# Patient Record
Sex: Female | Born: 1985 | Hispanic: No | Marital: Single | Smoking: Never smoker
Health system: Southern US, Community
[De-identification: ages and names within clinical notes are randomized; demographics above are authoritative.]

## PROBLEM LIST (undated history)

## (undated) DIAGNOSIS — J45909 Unspecified asthma, uncomplicated: Secondary | ICD-10-CM

## (undated) DIAGNOSIS — G4730 Sleep apnea, unspecified: Secondary | ICD-10-CM

## (undated) DIAGNOSIS — E119 Type 2 diabetes mellitus without complications: Secondary | ICD-10-CM

---

## 2014-06-26 ENCOUNTER — Emergency Department (HOSPITAL_COMMUNITY): Payer: Medicare Other

## 2014-06-26 ENCOUNTER — Emergency Department (HOSPITAL_COMMUNITY)
Admission: EM | Admit: 2014-06-26 | Discharge: 2014-06-27 | Disposition: A | Discharge status: Home / Self Care | Payer: Medicare Other | Attending: Emergency Medicine | Admitting: Emergency Medicine

## 2014-06-26 ENCOUNTER — Encounter (HOSPITAL_COMMUNITY): Payer: Self-pay | Admitting: Emergency Medicine

## 2014-06-26 DIAGNOSIS — N760 Acute vaginitis: Secondary | ICD-10-CM | POA: Insufficient documentation

## 2014-06-26 DIAGNOSIS — Z3202 Encounter for pregnancy test, result negative: Secondary | ICD-10-CM | POA: Diagnosis not present

## 2014-06-26 DIAGNOSIS — N390 Urinary tract infection, site not specified: Secondary | ICD-10-CM | POA: Insufficient documentation

## 2014-06-26 DIAGNOSIS — Z880 Allergy status to penicillin: Secondary | ICD-10-CM | POA: Insufficient documentation

## 2014-06-26 DIAGNOSIS — Y998 Other external cause status: Secondary | ICD-10-CM | POA: Insufficient documentation

## 2014-06-26 DIAGNOSIS — S91331A Puncture wound without foreign body, right foot, initial encounter: Secondary | ICD-10-CM | POA: Insufficient documentation

## 2014-06-26 DIAGNOSIS — E119 Type 2 diabetes mellitus without complications: Secondary | ICD-10-CM | POA: Insufficient documentation

## 2014-06-26 DIAGNOSIS — Y9301 Activity, walking, marching and hiking: Secondary | ICD-10-CM | POA: Insufficient documentation

## 2014-06-26 DIAGNOSIS — Y9289 Other specified places as the place of occurrence of the external cause: Secondary | ICD-10-CM | POA: Insufficient documentation

## 2014-06-26 DIAGNOSIS — M79671 Pain in right foot: Secondary | ICD-10-CM

## 2014-06-26 DIAGNOSIS — Z8669 Personal history of other diseases of the nervous system and sense organs: Secondary | ICD-10-CM | POA: Insufficient documentation

## 2014-06-26 DIAGNOSIS — N898 Other specified noninflammatory disorders of vagina: Secondary | ICD-10-CM | POA: Diagnosis present

## 2014-06-26 DIAGNOSIS — W25XXXA Contact with sharp glass, initial encounter: Secondary | ICD-10-CM | POA: Diagnosis not present

## 2014-06-26 DIAGNOSIS — J45909 Unspecified asthma, uncomplicated: Secondary | ICD-10-CM | POA: Diagnosis not present

## 2014-06-26 DIAGNOSIS — Z79899 Other long term (current) drug therapy: Secondary | ICD-10-CM | POA: Insufficient documentation

## 2014-06-26 DIAGNOSIS — B9689 Other specified bacterial agents as the cause of diseases classified elsewhere: Secondary | ICD-10-CM

## 2014-06-26 HISTORY — DX: Type 2 diabetes mellitus without complications: E11.9

## 2014-06-26 HISTORY — DX: Sleep apnea, unspecified: G47.30

## 2014-06-26 HISTORY — DX: Unspecified asthma, uncomplicated: J45.909

## 2014-06-26 LAB — URINE MICROSCOPIC-ADD ON

## 2014-06-26 LAB — POC URINE PREG, ED: Preg Test, Ur: NEGATIVE

## 2014-06-26 LAB — URINALYSIS, ROUTINE W REFLEX MICROSCOPIC
BILIRUBIN URINE: NEGATIVE
Glucose, UA: >1000 mg/dL — AB
KETONES UR: NEGATIVE mg/dL
NITRITE: POSITIVE — AB
PH: 6 (ref 5.0–8.0)
Protein, ur: NEGATIVE mg/dL
Specific Gravity, Urine: 1.044 — ABNORMAL HIGH (ref 1.005–1.030)
Urobilinogen, UA: 0.2 mg/dL (ref 0.0–1.0)

## 2014-06-26 NOTE — ED Notes (Signed)
Pt sts vaginal itching and burning with discharge that is grey/brown in color

## 2014-06-26 NOTE — ED Provider Notes (Signed)
CSN: [id number]     Arrival date & time 06/26/14  1621 History   First MD Initiated Contact with Patient 06/26/14 2032     Chief Complaint  Patient presents with  . Vaginal Discharge     (Consider location/radiation/quality/duration/timing/severity/associated sxs/prior Treatment) HPI Patient's 29 year old female with past medical history of diabetes who presents the ER with multiple complaints  Patient's main complaint tonight is a history of 4 days of vaginal irritation, itching and burning sensation externally with a discolored discharge. Patient states her symptoms have been gradual in onset, however persistent. Patient reports an associated vertigo sensation when she urinates. Patient denies any nausea, vomiting, abdominal pain, pelvic pain, cramping, vaginal bleeding. Patient states she is not currently sexually active, her last sexual encounter was several months ago, and patient states she only has sexual relationships with females.  Patient's second complaint that she brings up is what she believes to be a possible foreign body in her heel. Patient states she's walking through an apartment one week ago, stepped on a piece of glass which went through her shoe and into the sole of her foot. Patient states she did not visualize a glass, and feels that it has been retained in her foot. Patient states she's been able to unable to feel or pull anything out, and she is unsure of the size, shape or consistency of the foreign body itself. Patient denies numbness or weakness. Patient states her last tetanus booster was 2 years ago.  Past Medical History  Diagnosis Date  . Asthma   . Diabetes mellitus without complication   . Sleep apnea    History reviewed. No pertinent past surgical history. History reviewed. No pertinent family history. History  Substance Use Topics  . Smoking status: Never Smoker   . Smokeless tobacco: Not on file  . Alcohol Use: No   OB History    No data available      Review of Systems  Constitutional: Negative for fever.  HENT: Negative for trouble swallowing.   Eyes: Negative for visual disturbance.  Respiratory: Negative for shortness of breath.   Cardiovascular: Negative for chest pain.  Gastrointestinal: Negative for nausea, vomiting and abdominal pain.  Genitourinary: Positive for dysuria and vaginal discharge. Negative for urgency, vaginal bleeding and pelvic pain.  Musculoskeletal: Negative for neck pain.  Skin: Negative for rash.  Neurological: Negative for dizziness, weakness and numbness.  Psychiatric/Behavioral: Negative.       Allergies  Penicillins and Bactrim  Home Medications   Prior to Admission medications   Medication Sig Start Date End Date Taking? Authorizing Provider  metFORMIN (GLUCOPHAGE) 1000 MG tablet Take 1,000 mg by mouth 2 (two) times daily with a meal.   Yes Historical Provider, MD  ciprofloxacin (CIPRO) 500 MG tablet Take 1 tablet (500 mg total) by mouth 2 (two) times daily. One po bid x 7 days 06/27/14   Ladona Mow, PA-C  metroNIDAZOLE (METROGEL) 0.75 % vaginal gel Place 1 Applicatorful vaginally daily. For 5 days 06/27/14   Ladona Mow, PA-C   BP 125/74 mmHg  Pulse 74  Temp(Src) 98.1 F (36.7 C) (Oral)  Resp 16  SpO2 98%  LMP 05/28/2014 Physical Exam  Constitutional: She is oriented to person, place, and time. She appears well-developed and well-nourished. No distress.  Morbidly obese female well appearing, and in no acute distress.  HENT:  Head: Normocephalic and atraumatic.  Mouth/Throat: Oropharynx is clear and moist. No oropharyngeal exudate.  Eyes: Right eye exhibits no discharge. Left eye exhibits  no discharge. No scleral icterus.  Neck: Normal range of motion.  Cardiovascular: Normal rate, regular rhythm and normal heart sounds.   No murmur heard. Pulmonary/Chest: Effort normal and breath sounds normal. No respiratory distress.  Abdominal: Soft. There is no tenderness.  Genitourinary: Pelvic  exam was performed with patient supine. No labial fusion. There is tenderness on the right labia. There is no rash, lesion or injury on the right labia. There is tenderness on the left labia. There is no rash, lesion or injury on the left labia. Cervix exhibits no motion tenderness, no discharge and no friability. Right adnexum displays no mass, no tenderness and no fullness. Left adnexum displays no mass, no tenderness and no fullness. No erythema, tenderness or bleeding in the vagina. No foreign body around the vagina. No signs of injury around the vagina. Vaginal discharge found.  Mild amount of white, crusty discharge noted on labia bilaterally.  Pt has tenderness and itching with palpation to external labia and introitus.  Mild, white vaginal discharge noted in vaginal vault.  No CMT, no adnexal tenderness.  Chaperone present during entire pelvic exam.    Musculoskeletal: Normal range of motion. She exhibits no edema or tenderness.  Neurological: She is alert and oriented to person, place, and time. No cranial nerve deficit. Coordination normal.  Skin: Skin is warm and dry. No rash noted. She is not diaphoretic.  Punctate wound noted to the plantar aspect of right foot. No obvious abrasion, laceration, erythema, warmth, swelling, induration, signs of cellulitis, infection. Unable to visualize, palpate or appreciate any foreign body.  Psychiatric: She has a normal mood and affect.  Nursing note and vitals reviewed.   ED Course  Procedures (including critical care time) Labs Review Labs Reviewed  WET PREP, GENITAL - Abnormal; Notable for the following:    Yeast Wet Prep HPF POC FEW (*)    Clue Cells Wet Prep HPF POC FEW (*)    WBC, Wet Prep HPF POC MODERATE (*)    All other components within normal limits  URINALYSIS, ROUTINE W REFLEX MICROSCOPIC - Abnormal; Notable for the following:    APPearance CLOUDY (*)    Specific Gravity, Urine 1.044 (*)    Glucose, UA >1000 (*)    Hgb urine  dipstick TRACE (*)    Nitrite POSITIVE (*)    Leukocytes, UA SMALL (*)    All other components within normal limits  URINE MICROSCOPIC-ADD ON - Abnormal; Notable for the following:    Squamous Epithelial / LPF FEW (*)    Bacteria, UA MANY (*)    All other components within normal limits  URINE CULTURE  POC URINE PREG, ED  GC/CHLAMYDIA PROBE AMP ([place])    Imaging Review Dg Foot Complete Right  06/26/2014   CLINICAL DATA:  Patient stepped on glass with right foot 1 month ago with pain in the area of the heel, sensation of foreign body in the heel  EXAM: RIGHT FOOT COMPLETE - 3+ VIEW  COMPARISON:  None.  FINDINGS: No fracture or dislocation. Small heel spur. No radiodense foreign body. Mild arthritis of the tibiotalar joint. Mild arthritis of the articulation between the navicular and the adjacent cuneiform was. Moderate arthritis between the cuneiforms and the adjacent metatarsals.  IMPRESSION: Multifocal arthritic change. No radio opaque foreign body. However, please note that the soft tissues of the heel on the lateral view are projecting over another portion of anatomy leading to suboptimal visualization of the soft tissues. Optimal imaging would include repeating  the lateral foot radiograph.   Electronically Signed   By: Esperanza Heiraymond  Rubner M.D.   On: 06/26/2014 21:36     EKG Interpretation None      MDM   Final diagnoses:  Foot pain, right  Vulvovaginitis  Bacterial vaginosis  Puncture wound to foot, right, initial encounter  UTI (lower urinary tract infection)    1. Vaginal irritation Patient's signs and symptoms consistent with a urinary tract infection, in addition to a vulvovaginal candidiasis as well as a bacterial vaginosis based on exam and findings of urinalysis and vaginal wet prep. There is no concern for PID or cervicitis based on exam.   2. Possible foreign body Radiographs obtained of patient's foot, and although there is no obvious foreign body in the  cutaneous tissue appreciated, radiographic artifact was noted and repeat imaging was requested with imaging of the lateral foot radiograph by the radiologist. I notified patient of this, and notify her that we would place a bee on the area where she was experiencing pain, however patient adamantly declined having further radiograph of her foot. Patient states her main concern is her vaginal irritation, and she does not want to miss the bus. I discussed the benefits and risks of patient declining further evaluation of her foot given her history of diabetes and the high incidence of infection with foreign bodies through shoes, patient states she is adamant that she did not want any further workup on her foot at this time, and that she was mainly concerned about her vaginal irritation.  With patient's multiple complaints, we'll treat vulvovaginal candidiasis with a one-time dose of fluconazole in the ER. To avoid giving patient multiple oral antibiotics, we'll prescribe patient with a metronidazole applicator for her bacterial vaginosis, and place patient on ciprofloxacin for a urinary tract infection as well as for prophylaxis for her possible foreign body in her foot. Strongly encouraged patient follow-up with an OB/GYN, a referral to women's clinic for her gynecologic issues. Also strongly encouraged patient follow-up with orthopedics regarding her possible foreign body in her foot, and stressed the importance of this follow-up to avoid infection. I also encouraged patient follow-up with her primary care provider and provided patient with a resource guide to help her find one. I discussed return precautions with patient, and patient verbalizes understanding and agreement of this plan. I encouraged patient to call or return to the ER pending worsening of symptoms or should she have any questions or concerns.  BP 125/74 mmHg  Pulse 74  Temp(Src) 98.1 F (36.7 C) (Oral)  Resp 16  SpO2 98%  LMP  05/28/2014  Signed,  Ladona MowJoe Kienna Moncada, PA-C 2:34 AM  Patient discussed with Dr. Rolan BuccoMelanie Belfi, MD  Ladona MowJoe Toby Ayad, PA-C 06/27/14 [id number]  Rolan BuccoMelanie Belfi, MD 06/27/14 1759

## 2014-06-27 LAB — WET PREP, GENITAL: TRICH WET PREP: NONE SEEN

## 2014-06-27 LAB — GC/CHLAMYDIA PROBE AMP (~~LOC~~) NOT AT ARMC
CHLAMYDIA, DNA PROBE: NEGATIVE
Neisseria Gonorrhea: NEGATIVE

## 2014-06-27 MED ORDER — AZITHROMYCIN 1 G PO PACK: 2.0000 g | PACK | Freq: Once | ORAL | Status: DC

## 2014-06-27 MED ORDER — METRONIDAZOLE 0.75 % VA GEL: 1.0000 | Freq: Every day | VAGINAL | Status: AC

## 2014-06-27 MED ORDER — FLUCONAZOLE 100 MG PO TABS
150.0000 mg | ORAL_TABLET | Freq: Once | ORAL | Status: AC
  Administered 2014-06-27: 150 mg via ORAL
  Filled 2014-06-27: qty 2

## 2014-06-27 MED ORDER — CIPROFLOXACIN HCL 500 MG PO TABS: 500.0000 mg | ORAL_TABLET | Freq: Two times a day (BID) | ORAL | Status: AC

## 2014-06-27 NOTE — Discharge Instructions (Signed)
Follow up with Women's clinic for your vaginal discomfort.  Follow up with Orthopedics for the glass wound to your foot.  Take medications as prescribed.  Return to the ER with any severe swelling, redness, warmth of your foot, or high fever.  Refer to resource guide below to help find primary care provider.     Candidal Vulvovaginitis Candidal vulvovaginitis is an infection of the vagina and vulva. The vulva is the skin around the opening of the vagina. This may cause itching and discomfort in and around the vagina.  HOME CARE  Only take medicine as told by your doctor.  Do not have sex (intercourse) until the infection is healed or as told by your doctor.  Practice safe sex.  Tell your sex partner about your infection.  Do not douche or use tampons.  Wear cotton underwear. Do not wear tight pants or panty hose.  Eat yogurt. This may help treat and prevent yeast infections. GET HELP RIGHT AWAY IF:   You have a fever.  Your problems get worse during treatment or do not get better in 3 days.  You have discomfort, irritation, or itching in your vagina or vulva area.  You have pain after sex.  You start to get belly (abdominal) pain. MAKE SURE YOU:  Understand these instructions.  Will watch your condition.  Will get help right away if you are not doing well or get worse. Document Released: 06/27/2008 Document Revised: 04/05/2013 Document Reviewed: 06/27/2008 [facility] Patient Information 2015 [place], [place]. This information is not intended to replace advice given to you by your health care provider. Make sure you discuss any questions you have with your health care provider.  Bacterial Vaginosis Bacterial vaginosis is a vaginal infection that occurs when the normal balance of bacteria in the vagina is disrupted. It results from an overgrowth of certain bacteria. This is the most common vaginal infection in women of childbearing age. Treatment is important to prevent  complications, especially in pregnant women, as it can cause a premature delivery. CAUSES  Bacterial vaginosis is caused by an increase in harmful bacteria that are normally present in smaller amounts in the vagina. Several different kinds of bacteria can cause bacterial vaginosis. However, the reason that the condition develops is not fully understood. RISK FACTORS Certain activities or behaviors can put you at an increased risk of developing bacterial vaginosis, including:  Having a new sex partner or multiple sex partners.  Douching.  Using an intrauterine device (IUD) for contraception. Women do not get bacterial vaginosis from toilet seats, bedding, swimming pools, or contact with objects around them. SIGNS AND SYMPTOMS  Some women with bacterial vaginosis have no signs or symptoms. Common symptoms include:  Grey vaginal discharge.  A fishlike odor with discharge, especially after sexual intercourse.  Itching or burning of the vagina and vulva.  Burning or pain with urination. DIAGNOSIS  Your health care provider will take a medical history and examine the vagina for signs of bacterial vaginosis. A sample of vaginal fluid may be taken. Your health care provider will look at this sample under a microscope to check for bacteria and abnormal cells. A vaginal pH test may also be done.  TREATMENT  Bacterial vaginosis may be treated with antibiotic medicines. These may be given in the form of a pill or a vaginal cream. A second round of antibiotics may be prescribed if the condition comes back after treatment.  HOME CARE INSTRUCTIONS   Only take over-the-counter or prescription medicines as  directed by your health care provider.  If antibiotic medicine was prescribed, take it as directed. Make sure you finish it even if you start to feel better.  Do not have sex until treatment is completed.  Tell all sexual partners that you have a vaginal infection. They should see their health  care provider and be treated if they have problems, such as a mild rash or itching.  Practice safe sex by using condoms and only having one sex partner. SEEK MEDICAL CARE IF:   Your symptoms are not improving after 3 days of treatment.  You have increased discharge or pain.  You have a fever. MAKE SURE YOU:   Understand these instructions.  Will watch your condition.  Will get help right away if you are not doing well or get worse. FOR MORE INFORMATION  Centers for Disease Control and Prevention, Division of STD Prevention: SolutionApps.co.za American Sexual Health Association (ASHA): www.ashastd.org  Document Released: 03/31/2005 Document Revised: 01/19/2013 Document Reviewed: 11/10/2012 [facility] Patient Information 2015 [place], [place]. This information is not intended to replace advice given to you by your health care provider. Make sure you discuss any questions you have with your health care provider.   Splinters Splinters need to be removed because they can cause skin irritation and infection. If they are close to the surface, splinters can usually be removed easily. Deep splinters may be hard to locate and need treatment by a surgeon. SPLINTER REMOVAL Removal of splinters by your caregiver is considered a surgical procedure.   The area is carefully cleaned. You may require a small amount of anesthesia (medicine injected near the splinter to numb the tissue and lessen pain). After the splinter is removed, the area will be cleaned again. A bandage is applied.  If your splinter is under a fingernail or toenail, then a small section of the nail may need to be removed. As long as the splinter did not extend to the base of the nail, the nail usually grows back normally.  A splinter that is deeper, more contaminated, or that gets near a structure such as a bone, nerve or blood vessel may need to be removed by a Careers adviser.  You may need special X-rays or scans if the splinter is hard to  locate.  Every attempt is made to remove the entire splinter. However, small particles may remain. Tell your caregiver if you feel that a part of the splinter was left behind. HOME CARE INSTRUCTIONS   Keep the injured area high up (elevated).  Use the injured area as little as possible.  Keep the injured area clean and dry. Follow any directions from your caregiver.  Keep any follow-up or wound check appointments. You might need a tetanus shot now if:  You have no idea when you had the last one.  You have never had a tetanus shot before.  The injured area had dirt in it. Even if you have already removed the splinter, call your caregiver to get a tetanus shot if you need one.  If you need a tetanus shot, and you decide not to get one, there is a rare chance of getting tetanus. Sickness from tetanus can be serious. If you did get a tetanus shot, your arm may swell, get red and warm to the touch at the shot site. This is common and not a problem. SEEK MEDICAL CARE IF:   A splinter has been removed, but you are not better in a day or two.  You develop  a temperature.  Signs of infection develop such as:  Redness, swelling or pus around the wound.  Red streaks spreading back from your wound towards your body. Document Released: 05/08/2004 Document Revised: 08/15/2013 Document Reviewed: 04/10/2008 [facility] Patient Information 2015 [place], [place]. This information is not intended to replace advice given to you by your health care provider. Make sure you discuss any questions you have with your health care provider.   Emergency Department Resource Guide 1) Find a Doctor and Pay Out of Pocket Although you won't have to find out who is covered by your insurance plan, it is a good idea to ask around and get recommendations. You will then need to call the office and see if the doctor you have chosen will accept you as a new patient and what types of options they offer for patients who are  self-pay. Some doctors offer discounts or will set up payment plans for their patients who do not have insurance, but you will need to ask so you aren't surprised when you get to your appointment.  2) Contact Your Local Health Department Not all health departments have doctors that can see patients for sick visits, but many do, so it is worth a call to see if yours does. If you don't know where your local health department is, you can check in your phone book. The CDC also has a tool to help you locate your state's health department, and many state websites also have listings of all of their local health departments.  3) Find a Walk-in Clinic If your illness is not likely to be very severe or complicated, you may want to try a walk in clinic. These are popping up all over the country in pharmacies, drugstores, and shopping centers. They're usually staffed by nurse practitioners or physician assistants that have been trained to treat common illnesses and complaints. They're usually fairly quick and inexpensive. However, if you have serious medical issues or chronic medical problems, these are probably not your best option.  No Primary Care Doctor: - Call Health Connect at  [phone] - they can help you locate a primary care doctor that  accepts your insurance, provides certain services, etc. - Physician Referral Service- [phone]  Chronic Pain Problems: Organization         Address  Phone   Notes  [facility]  [phone] Patients need to be referred by their primary care doctor.   Medication Assistance: Organization         Address  Phone   Notes  [facility] Medication [facility] [address] [place], [place] [address] [phone] --Must be a resident of [facility] -- Must have NO insurance coverage whatsoever (no Medicaid/ Medicare, etc.) -- The pt. MUST have a primary care doctor that directs their care regularly and follows them in  the community   Med[facility]ist  [phone]   [employer]  [phone]    Agencies that provide inexpensive medical care: Organization         Address  Phone   Notes  [facility]  [phone]   [facility]    [phone]   [facility] [address] [place], [place] [address] [phone]   [facility] [address] [place], [place] [phone]   Planned Parenthood    [phone]   [facility]    [phone]   [facility]  [address], [place] Phone:  [phone](336) [phone], Fax:  [phone] Hours of Operation:  9 am - 6 pm, M-F.  Also accepts Medicaid/Medicare and self-pay.  [facility]  [address], [place] Phone: [phone]) [phone], Fax: [phone]. Hours of Operation:  8:30 am - 5:30 pm, M-F.  Also accepts Medicaid and self-pay.  [facility] [place] [address], [place] Phone: [phone]) [phone]   [facility] [address], [place], [place] [phone], Ext. [phone] Mondays & Thursdays: 7-9 AM.  First 15 patients are seen on a first come, first serve basis.    Medicaid-accepting [facility] Providers:  Organization         Address  Phone   Notes  [facility] [address], [place] [phone] Also accepts self-pay patients.  [facility] [address] [place], [place]  [phone]) [phone]   [facility] [address], [place] [phone]   [facility] [address] [place] [address], [place] [phone]) [phone]   Renaye RakersVeita Bland [address], [place]   [phone] Only accepts [place] Access [place] patients after they have their name applied to their card.   Self-Pay (no insurance) in [facility]:  Organization         Address  Phone   Notes  Sickle Cell Patients,  [facility] [address], [place] [phone]) [phone]   [facility] [address], [place] [phone]) [phone]   [facility] [place]  [address], [place] [phone]   [facility]/Dr. Osei-Bonsu  [address] [place] [address], [place] or [phone], [place] [phone]) [phone] Phone number for both [place] and [place] locations is the same.  [facility] [address], [place] [phone](336) [phone]   [facility] [address] [place] [address], [place] or [address] [phone](336) [phone](336) [phone]   [facility] [address], [place] [phone](336) [phone], phone; [phone](336) [phone], fax Sees patients 1st and 3rd Saturday of every month.  Must not qualify for public or private insurance (i.e. Medicaid, Medicare, [place] Health Choice, Veterans' Benefits)  Household income should be no more than 200% of the poverty level The clinic cannot treat you if you are pregnant or think you are pregnant  Sexually transmitted diseases are not treated at the clinic.    Dental Care: Organization         Address  Phone  Notes  [facility] of [facility] [address], [place] [phone]) [phone] Accepts children up to age 29 who are enrolled in [place] or [place] Health Choice; pregnant women with a Medicaid card; and children who have applied for Medicaid or [place] Health Choice, but were declined, whose parents can pay a reduced fee at time of service.  [facility] of [facility] [place]  [address], [place] [phone] Accepts children up to age 29 who are enrolled in [place] or [place] Health Choice; pregnant women with a Medicaid card; and children who have applied for Medicaid or  Health Choice, but were declined, whose parents can pay a reduced fee at time of service.  [facility]   [phone] [address]  [address], [place] [phone] Patients are seen by appointment only. Walk-ins are not accepted. [facility] will see patients 70 years of age and older. Monday - Tuesday (8am-5pm) Most Wednesdays (8:30-5pm) $30 per visit, cash only  [facility] Adult Dental Access PROGRAM  [address], [facility] [phone] Patients are seen by appointment only. Walk-ins are not accepted. [facility] will see patients 72 years of age and older. One Wednesday Evening (Monthly: Volunteer Based).  $30 per visit, cash only  [employer]  [phone] for adults; Children under age 22, call Graduate Pediatric Dentistry at [phone]. Children aged 56-14, please call [phone] to request a pediatric application.  Dental services are provided in all areas of dental care including fillings, crowns and bridges, complete and partial dentures, implants, gum treatment, root canals, and extractions. Preventive care is also provided. Treatment is provided to both adults and children. Patients are selected via a lottery and there is often a waiting list.   [facility] [address] [place]  [phone] [email or web address]   [facility] [address] [place], [place] [phone], Ext. [phone] Second and Fourth Thursday of each month, opens at 6:30 AM; Clinic ends at 9 AM.  Patients are seen on a first-come first-served basis, and a limited number are seen during each clinic.   [facility]  [address] [place] [place], [place] [phone]   Eligibility Requirements You must have lived in [place], [place], or [place] counties for at least the last three months.   You cannot be eligible for state or federal sponsored [place], including [employer], [place], or [employer].   You generally cannot be eligible for healthcare insurance through your employer.    How to apply: Eligibility screenings are  held every Tuesday and Wednesday afternoon from 1:00 pm until 4:00 pm. You do not need an appointment for the interview!  [facility] [address] [place], [place] [phone]   [facility] Health Department  [phone]   [facility] Health Department  [phone]   [facility] Health Department  [phone]    Behavioral Health Resources in the Community: Intensive Outpatient Programs Organization         Address  Phone  Notes  [facility] [address] [place], [place] [phone]   [facility] [address] [place], [place] [phone]   [facility] [address], [place], [place]  [phone]   [facility] Mental Health [address]  [place], [place] [phone] or [phone]   Substance Abuse Resources Organization         Address  Phone  Notes  [facility] Drug [facility]  [phone]   [facility]  [phone]   [facility] [place]  [phone]   Floydene Flock  [phone]   Residential & Outpatient Substance Abuse Program  [phone]   Psychological Services Organization         Address  Phone  Notes  [facility] Health  [phone]   [facility]  [phone]   [facility] [address], [place] [phone] or [phone]    Mobile Crisis Teams Organization         Address  Phone  Notes  [facility], Mobile Crisis Care Unit  [phone]   [facility]  [address] [place], [place] [phone]   Doristine Locks  [address] [place] [phone]    Self-Help/Support Groups Organization         Address  Phone             Notes  [facility]ental Health Assoc. of [place] - variety of support groups  336- I7437963 Call for more information  Narcotics Anonymous (NA), [facility] [address], [employer] [place]  2 meetings at this location     Statistician         Address  Phone  Notes  [facility] [address],    [place]  [phone]   [facility]  [address], [place] [address], [place], [place] [phone]   [facility] [address] [place], [place] [phone] Admissions: 8am-3pm M-F  [facility] [address]    [place], [place] [phone]   [facility] [address] [place], [place], [place] [phone]   [facility] [address]  [place], [place] [phone]   [facility] - Intensive Outpatient [address], [place], [place] [phone]   [facility] ([facility] [address]  [place], [place] [phone] or [phone]   [facility] (RTS) [address], [place], [place] [phone] Accepts Medicaid  [facility] [place] [address]  [place] [phone] Substance Abuse/Addiction Treatment   [facility] Organization         Address  Phone  Notes  [facility]  [phone]   Angie Fava, PhD [address] [place], [place]   [phone] or [phone]   [facility] Behavioral   [address] [employer], [place] [phone]   [facility] [address], [place], [place] [phone] Insurance/Medicaid/sponsorship through [facility] and Families [address]                                    [place], [place] [phone] Therapy/tele-psych/case  [facility] [address], [place] [phone]    Dr. Lolly Mustache  [phone]   [facility]  [employer] [facility] Dept. 1) [address], [place] 2) [address] [place] 3)  [address], [place] [phone]  [phone]   [facility] Child Abuse Hotline 775-423-6454 or (939) [phone] (After  Hours)

## 2014-06-28 LAB — URINE CULTURE

## 2014-09-02 ENCOUNTER — Emergency Department (HOSPITAL_COMMUNITY)
Admission: EM | Admit: 2014-09-02 | Discharge: 2014-09-13 | Disposition: E | Payer: Medicare Other | Attending: Emergency Medicine | Admitting: Emergency Medicine

## 2014-09-02 ENCOUNTER — Encounter (HOSPITAL_COMMUNITY): Payer: Self-pay

## 2014-09-02 DIAGNOSIS — S00511A Abrasion of lip, initial encounter: Secondary | ICD-10-CM | POA: Insufficient documentation

## 2014-09-02 DIAGNOSIS — Y999 Unspecified external cause status: Secondary | ICD-10-CM | POA: Insufficient documentation

## 2014-09-02 DIAGNOSIS — X58XXXA Exposure to other specified factors, initial encounter: Secondary | ICD-10-CM | POA: Diagnosis not present

## 2014-09-02 DIAGNOSIS — I469 Cardiac arrest, cause unspecified: Secondary | ICD-10-CM | POA: Diagnosis not present

## 2014-09-02 DIAGNOSIS — Y939 Activity, unspecified: Secondary | ICD-10-CM | POA: Diagnosis not present

## 2014-09-02 DIAGNOSIS — Y929 Unspecified place or not applicable: Secondary | ICD-10-CM | POA: Insufficient documentation

## 2014-09-02 MED ORDER — ETOMIDATE 2 MG/ML IV SOLN
INTRAVENOUS | Status: AC | PRN
  Administered 2014-09-02: 10 mg via INTRAVENOUS

## 2014-09-02 MED ORDER — SODIUM BICARBONATE 8.4 % IV SOLN
INTRAVENOUS | Status: AC | PRN
  Administered 2014-09-02: 100 meq via INTRAVENOUS

## 2014-09-02 MED ORDER — EPINEPHRINE HCL 0.1 MG/ML IJ SOSY
PREFILLED_SYRINGE | INTRAMUSCULAR | Status: AC | PRN
  Administered 2014-09-02: 0.1 mg via INTRAVENOUS

## 2014-09-02 MED ORDER — SODIUM CHLORIDE 0.9 % IV SOLN
INTRAVENOUS | Status: AC | PRN
  Administered 2014-09-02: 1000 mL via INTRAVENOUS

## 2014-09-02 MED ORDER — ROCURONIUM BROMIDE 50 MG/5ML IV SOLN
INTRAVENOUS | Status: AC | PRN
  Administered 2014-09-02: 100 mg via INTRAVENOUS

## 2014-09-02 MED ORDER — CALCIUM CHLORIDE 10 % IV SOLN
INTRAVENOUS | Status: AC | PRN
  Administered 2014-09-02: 1 g via INTRAVENOUS

## 2014-09-04 MED FILL — Medication: Qty: 1 | Status: AC

## 2014-09-13 NOTE — Progress Notes (Signed)
Chaplain responded to page from triage concerning arriving CPR.   Chaplain stopped by room to get status of pt, pt was  Deceased when Chaplain arrived.   Pt mother, father, sister and 29 yr old nephew in the consultation room as physician gave death notification.   Family is extremely overwhelmed as this is very sudden and the pt is very young.   Pt's sister is the most emotionally expressive, pt father and mother are attempting to stay collected for the grandchild and are still wrestling with the shock of it all.   Awaiting clearance to bring family to the room.   Family requested prayer and Chaplain prayed with family and provided emotional/spiritual support.   Angela Singleton, Angela Singleton, Chaplain 17-Dec-2014

## 2014-09-13 NOTE — ED Provider Notes (Signed)
CSN: [id number]     Arrival date & time 08-Oct-2014  0703 History   First MD Initiated Contact with Patient 026-Jun-2016 0720     Chief Complaint  Patient presents with  . Cardiac Arrest     (Consider location/radiation/quality/duration/timing/severity/associated sxs/prior Treatment) HPI  This is a 29 year old female who presents in cardiac arrest. Per EMS report, they were called to the patient's house after a syncopal episode. Upon arrival, patient was noted to be in respiratory distress but was awake and alert. She had notable facial trauma.  During initial evaluation, patient went And went into asystolic arrest. She received CPR for approximately 40 minutes prior to arrival. She received a total of 6 mg of epinephrine. She never achieved ROSC.  Upon arrival, patient was CPR in progress. King airway in place. Pupils 7 mm in nonreactive bilaterally. Initial pulse and rhythm check with asystolic arrest. CPR resumed. Patient given 3 mg of epinephrine total, 2 A of sodium bicarbonate, and 1 g of calcium chloride. Patient was intubated with good entitle CO2.  Patient never had ROSC. Time of death 7:19 AM.   History reviewed. No pertinent past medical history. History reviewed. No pertinent past surgical history. No family history on file. History  Substance Use Topics  . Smoking status: Never Smoker   . Smokeless tobacco: Not on file  . Alcohol Use: Not on file   OB History    No data available     Review of Systems  Unable to perform ROS: Acuity of condition      Allergies  Review of patient's allergies indicates not on file.  Home Medications   Prior to Admission medications   Not on File   There were no vitals taken for this visit. Physical Exam  Constitutional:  Morbidly obese, CPR in progress, obtunded, GCS 3  HENT:  Head: Normocephalic.  Dry blood about the oral cavity, abrasions noted over the lower lip  Eyes:  Pupils 7 mm nonreactive bilaterally  Cardiovascular:   Asystole, pulseless  Pulmonary/Chest:  Distant breath sounds with bagging, limited by body habitus  Neurological:  GCS 3  Nursing note and vitals reviewed.   ED Course  Procedures (including critical care time)  Cardiopulmonary Resuscitation (CPR) Procedure Note Directed/Performed by: Shon BatonHORTON, COURTNEY F I personally directed ancillary staff and/or performed CPR in an effort to regain return of spontaneous circulation and to maintain cardiac, neuro and systemic perfusion.   CRITICAL CARE Performed by: Shon BatonHORTON, COURTNEY F   Total critical care time: 60 min  Critical care time was exclusive of separately billable procedures and treating other patients.  Critical care was necessary to treat or prevent imminent or life-threatening deterioration.  Critical care was time spent personally by me on the following activities: development of treatment plan with patient and/or surrogate as well as nursing, discussions with consultants, evaluation of patient's response to treatment, examination of patient, obtaining history from patient or surrogate, ordering and performing treatments and interventions, ordering and review of laboratory studies, ordering and review of radiographic studies, pulse oximetry and re-evaluation of patient's condition.  INTUBATION Performed by: Shon BatonHORTON, COURTNEY F  Required items: required blood products, implants, devices, and special equipment available Patient identity confirmed: provided demographic data and hospital-assigned identification number Time out: Immediately prior to procedure a "time out" was called to verify the correct patient, procedure, equipment, support staff and site/side marked as required.  Indications: Cardiac arrest   Intubation method: Glidescope Laryngoscopy   Preoxygenation: BVM  Sedatives: Etomidate Paralytic: Roc  Tube Size: 7.5 cuffed  Post-procedure assessment:ETCO2 monitor Breath sounds: equal and absent over the  epigastrium Tube secured with: ETT holder Chest x-ray interpreted by radiologist and me.   Patient tolerated the procedure well with no immediate complications.    Labs Review Labs Reviewed - No data to display  Imaging Review No results found.   EKG Interpretation None      MDM   Final diagnoses:  Cardiac arrest    Patient presents in arrest. Currently an asystolic arrest. CPR in progress for greater than 40 minutes. Definitive airway placed and several rounds of CPR continued without return of circulation. Time of death called at 7:19 AM.   Family informed of patient's death. She did not have a primary care physician.  Spoke with the medical examiner's office. They will call back to Dr. Patria Mane regarding whether or not this is an ME case.    Shon Baton, MD 09-25-14 [phone]

## 2014-09-13 NOTE — Progress Notes (Signed)
Chaplain Note:   Chaplain with family as they are taking some time to be bedside with one another and taking some time alone.   Family shared some loving memories and told stories about the pt's vibrancy, "infectious laugh", "Lively spirit" and her "strength".   They noted the last four years were increasingly difficult for her.   They find some comfort in their spirituality. "We know she's not suffering anymore but it hurts so badly".   Express that the emotions come in waves.   Gala RomneyBrown, Mattison Golay J, Chaplain December 20, 2014

## 2014-09-13 NOTE — ED Provider Notes (Signed)
Spoke with ME, pt will not be a medical examiner case. Dr Wilkie AyeHorton to sign the death certificate  Angela BilisKevin Yatziry Deakins, MD 29-Sep-2014 [phone]

## 2014-09-13 NOTE — Progress Notes (Signed)
Chaplain Note:   Chaplain attained next of kin information. Given to nurse.   Also informed nurse that family wished to speak to care team about what happened as they are now in a place to hear it.   Family bedside. Closed visit with pt placement information and prayer.   No further support needed.   Chaplain signing off.

## 2014-09-13 NOTE — Code Documentation (Signed)
Asystole on the monitor, CPR continues, no pulse per Dr. Wilkie AyeHorton.

## 2014-09-13 NOTE — Code Documentation (Signed)
Annice PihJackie ME examiner called and stated, "At this time, I am declining the pt. As a Medical Examiners Case."

## 2014-09-13 NOTE — Code Documentation (Signed)
CPR in progress, Pulse check Asystole on the monitor, no pulse per Dr. Wilkie AyeHorton

## 2014-09-13 NOTE — Code Documentation (Signed)
Organ procurement team notified. Spoke with [name], Referral number is [phone] [id number].

## 2014-09-13 NOTE — Procedures (Signed)
Intubation Procedure Note Wiliam KeBella Amor Vanwingerden [id number] 11/16/1985  Procedure: Intubation Indications: ETT needed to be changed  Procedure Details Consent: Unable to obtain consent because of emergent medical necessity. Time Out: Verified patient identification, verified procedure, site/side was marked, verified correct patient position, special equipment/implants available, medications/allergies/relevent history reviewed, required imaging and test results available.  Performed  Maximum sterile technique was used including gloves, gown, hand hygiene and mask.  3    Evaluation Hemodynamic Status: BP never obtained; O2 sats: currently acceptable Patient's Current Condition: unstable Complications: No apparent complications Patient did tolerate procedure well. Chest X-ray ordered to verify placement.  CXR: pending.  [facility]. King removed, 7.5 ett replaced with difficult intubation due to very large tongue. Positive color change on ETCO2, direct visualization with glidescope. CXR pending.    Carolan ShiverKelley, Paytin Ramakrishnan M 12/28/2014

## 2014-09-13 NOTE — Code Documentation (Signed)
Family at beside. Family given emotional support. 

## 2014-09-13 NOTE — Progress Notes (Signed)
RT responded to CPR in Trauma A. Pt manually ventilated throughout. Pt with King airway upon arrival to ED. Continuous CPR. King Airway removed and 7.5 ETT replaced, difficult intubation due to large tongue. ETT secured at 24 at the lip. Resucitation efforts stopped and time of death called by ED MD at 07:19. ETT remains in place due to possible ME case.

## 2014-09-13 NOTE — ED Notes (Addendum)
Pt. Was found in bathroom, via [facility], face down. It was reported that she was in sinus Bradycardia .  Paramedics reports they witnessed Asystole and began CPR,for approximately 20 minutes in the bathroom. Pt. Arrived in Trauma A in asystole with a King Airway and CPR in progress  Dr. Wilkie AyeHorton and Dr. Patria Maneampos  at the bedside, RT at the bedside, CPR continued.

## 2014-09-13 DEATH — deceased

## 2014-10-13 ENCOUNTER — Ambulatory Visit (HOSPITAL_COMMUNITY): Payer: Medicare Other | Admitting: Psychiatry

## 2014-10-24 ENCOUNTER — Encounter (HOSPITAL_COMMUNITY): Payer: Self-pay | Admitting: Emergency Medicine

## 2016-09-14 IMAGING — CR DG FOOT COMPLETE 3+V*R*
3 series · 3 of 3 positions shown · non-contrast
Comparison: None.

CLINICAL DATA: Patient stepped on glass with right foot 1 month ago
with pain in the area of the heel, sensation of foreign body in the
heel

EXAM:
RIGHT FOOT COMPLETE - 3+ VIEW

[foot ap]
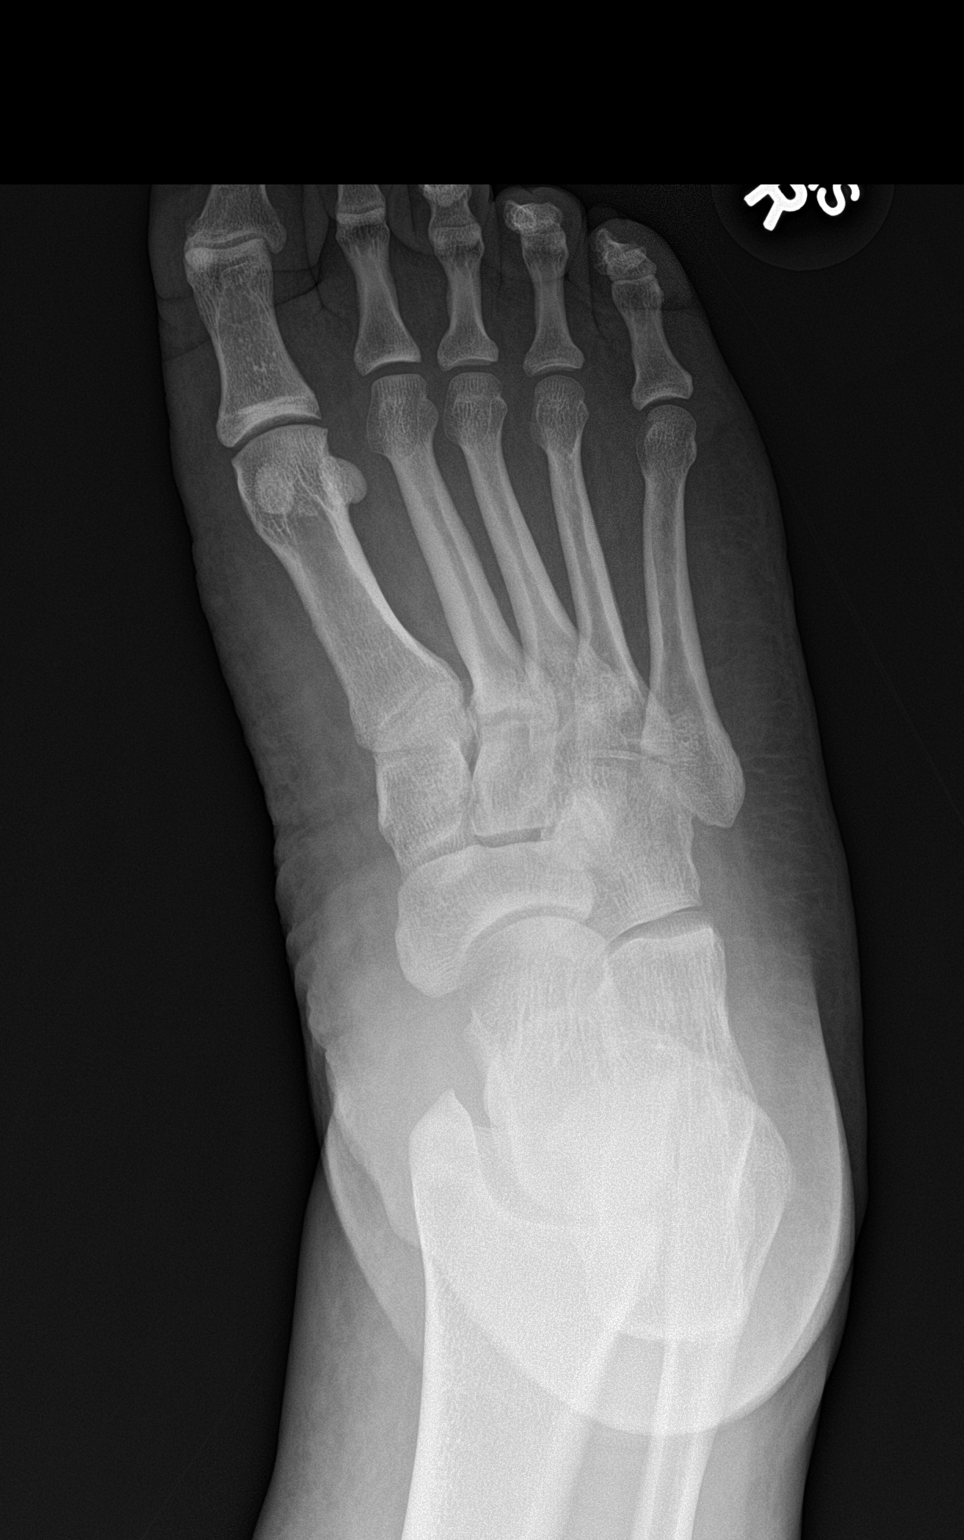

[foot obl]
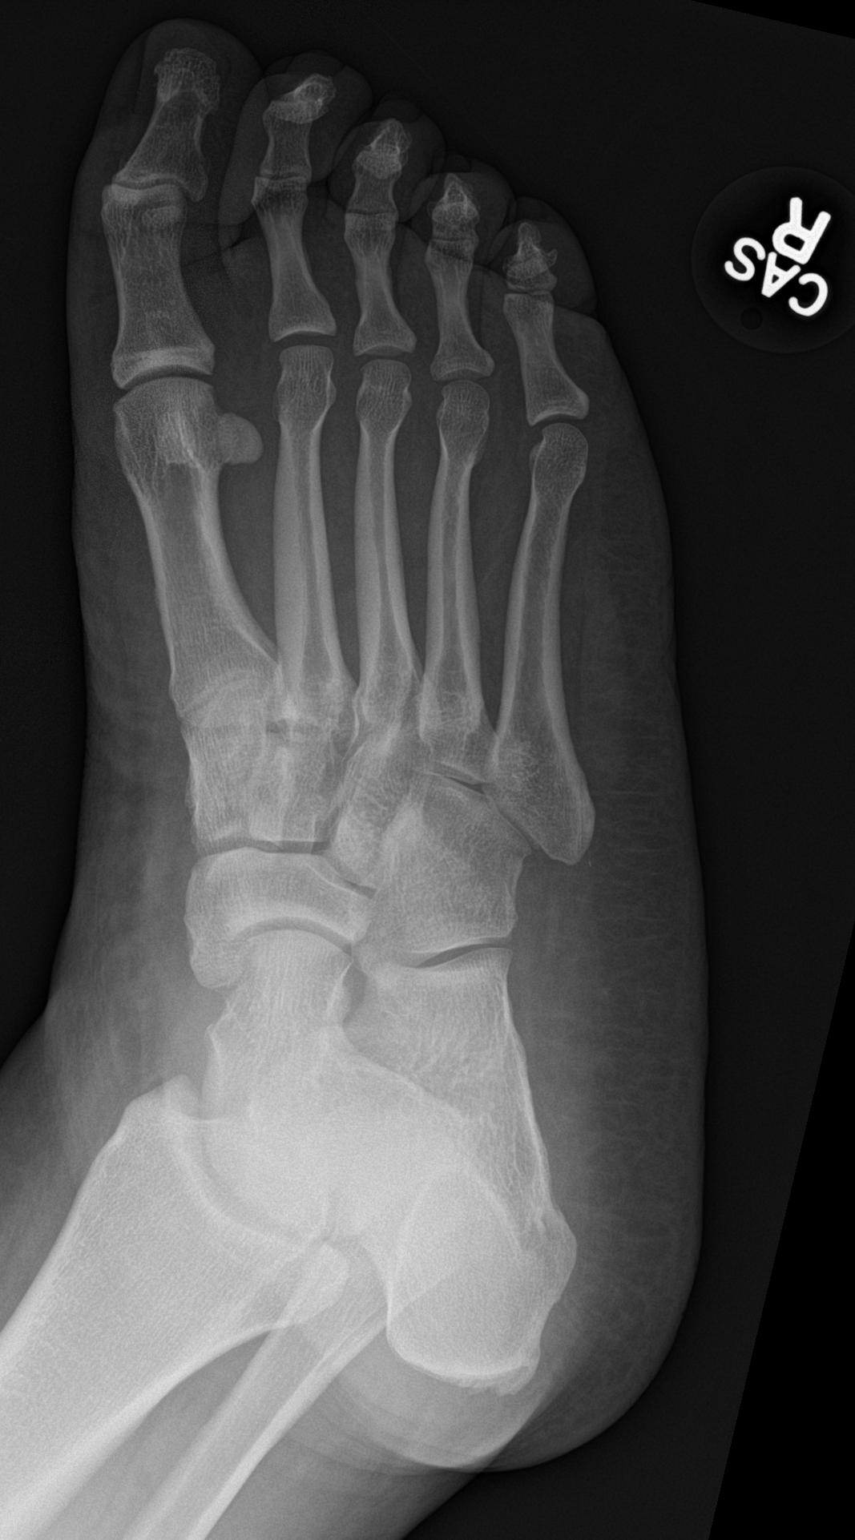

[foot lat]
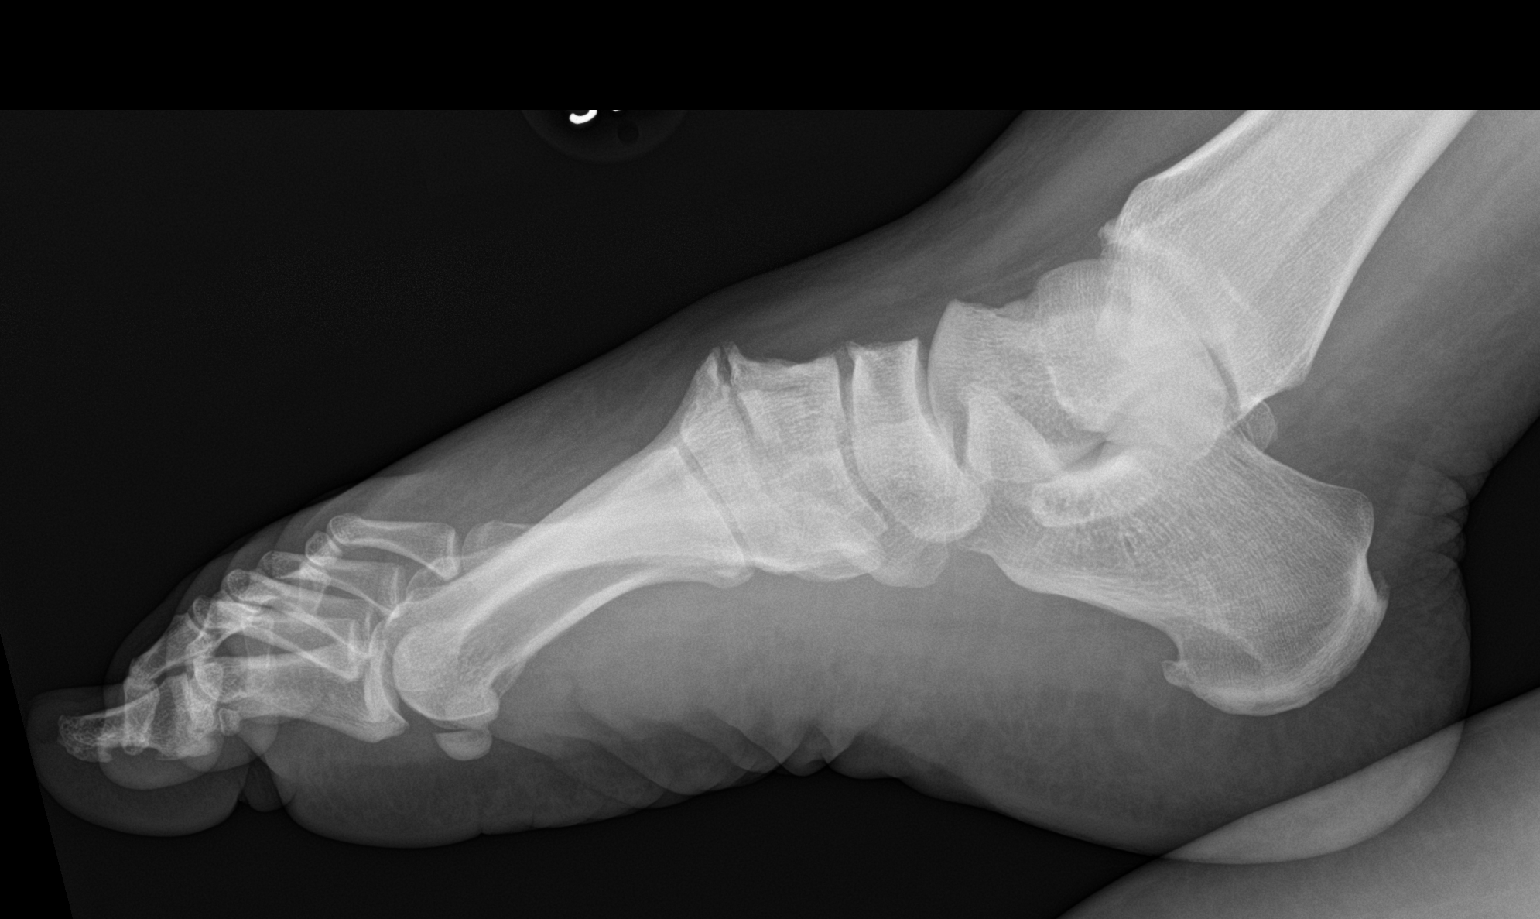

[3 of 3 positions shown; findings below may reference images not displayed]

FINDINGS: No fracture or dislocation. Small heel spur. No radiodense foreign
body. Mild arthritis of the tibiotalar joint. Mild arthritis of the
articulation between the navicular and the adjacent cuneiform was.
Moderate arthritis between the cuneiforms and the adjacent
metatarsals.
IMPRESSION: Multifocal arthritic change. No radio opaque foreign body. However,
please note that the soft tissues of the heel on the lateral view
are projecting over another portion of anatomy leading to suboptimal
visualization of the soft tissues. Optimal imaging would include
repeating the lateral foot radiograph.
